# Patient Record
Sex: Male | Born: 1990 | Race: White | Hispanic: No | Marital: Single | Smoking: Current every day smoker
Health system: Southern US, Community
[De-identification: ages and names within clinical notes are randomized; demographics above are authoritative.]

---
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
## 2012-04-19 ENCOUNTER — Emergency Department (HOSPITAL_COMMUNITY): Payer: 59

## 2012-04-19 ENCOUNTER — Emergency Department (HOSPITAL_COMMUNITY)
Admission: EM | Admit: 2012-04-19 | Discharge: 2012-04-19 | Disposition: A | Discharge status: Home / Self Care | Payer: 59 | Attending: Emergency Medicine | Admitting: Emergency Medicine

## 2012-04-19 DIAGNOSIS — J019 Acute sinusitis, unspecified: Secondary | ICD-10-CM | POA: Insufficient documentation

## 2012-04-19 DIAGNOSIS — J3489 Other specified disorders of nose and nasal sinuses: Secondary | ICD-10-CM | POA: Insufficient documentation

## 2012-04-19 DIAGNOSIS — R059 Cough, unspecified: Secondary | ICD-10-CM | POA: Insufficient documentation

## 2012-04-19 DIAGNOSIS — J392 Other diseases of pharynx: Secondary | ICD-10-CM | POA: Insufficient documentation

## 2012-04-19 DIAGNOSIS — J029 Acute pharyngitis, unspecified: Secondary | ICD-10-CM | POA: Insufficient documentation

## 2012-04-19 DIAGNOSIS — B338 Other specified viral diseases: Secondary | ICD-10-CM | POA: Insufficient documentation

## 2012-04-19 DIAGNOSIS — R05 Cough: Secondary | ICD-10-CM | POA: Insufficient documentation

## 2012-04-19 DIAGNOSIS — J329 Chronic sinusitis, unspecified: Secondary | ICD-10-CM

## 2012-04-19 DIAGNOSIS — J069 Acute upper respiratory infection, unspecified: Secondary | ICD-10-CM | POA: Insufficient documentation

## 2012-04-19 MED ORDER — AMOXICILLIN 500 MG PO CAPS: 500.0000 mg | ORAL_CAPSULE | Freq: Three times a day (TID) | ORAL | Status: DC

## 2012-04-19 NOTE — ED Provider Notes (Signed)
History     CSN: [id number]  Arrival date & time 04/19/12  1415   First MD Initiated Contact with Patient 04/19/12 1453      No chief complaint on file.   (Consider location/radiation/quality/duration/timing/severity/associated sxs/prior treatment) HPI Nicolas Lopez is a 21 y.o. male who presents to ED with complaint of fever, chills, nasal congestion, sinus pain, cough. State chills at home, sweats, did not measure temperature. Symptoms began 4 days ago, worsening. Pt has been taking mucinex, afrin, netti pot for congestion with no relief. States every year gets sinus infection and only improves with antibitotics. Per mother who is here with pt, states he has felt very hot, and gets sweats, no energy, appears weak. Pt denies headache or neck pain or stiffness. No n/v/d. No abdominal pain. No SOB or chest pain.   No past medical history on file.  No past surgical history on file.  No family history on file.  History  Substance Use Topics  . Smoking status: Not on file  . Smokeless tobacco: Not on file  . Alcohol Use: Not on file      Review of Systems  Constitutional: Negative for fever and chills.  HENT: Positive for congestion, sore throat, postnasal drip and sinus pressure. Negative for neck pain and neck stiffness.   Respiratory: Positive for cough. Negative for chest tightness, shortness of breath and wheezing.   Cardiovascular: Negative.   Gastrointestinal: Negative.   Genitourinary: Negative for dysuria, urgency and hematuria.  Musculoskeletal: Negative.   Skin: Negative for rash.  Neurological: Negative for weakness and headaches.  Hematological: Negative for adenopathy.    Allergies  Review of patient's allergies indicates not on file.  Home Medications   Current Outpatient Rx  Name  Route  Sig  Dispense  Refill  . GUAIFENESIN ER 600 MG PO TB12   Oral   Take 1,200 mg by mouth 2 (two) times daily.         Marland Kitchen OXYMETAZOLINE HCL 0.05 % NA SOLN    Nasal   Place 2 sprays into the nose 2 (two) times daily.           BP 146/75  Pulse 80  Temp 98.8 F (37.1 C) (Oral)  Resp 16  SpO2 99%  Physical Exam  Nursing note and vitals reviewed. Constitutional: He is oriented to person, place, and time. He appears well-developed and well-nourished. No distress.  HENT:  Head: Atraumatic.  Right Ear: Tympanic membrane, external ear and ear canal normal.  Left Ear: Tympanic membrane, external ear and ear canal normal.  Nose: Mucosal edema and rhinorrhea present. Right sinus exhibits maxillary sinus tenderness and frontal sinus tenderness. Left sinus exhibits maxillary sinus tenderness and frontal sinus tenderness.  Mouth/Throat: Uvula is midline and mucous membranes are normal. Posterior oropharyngeal erythema present. No oropharyngeal exudate, posterior oropharyngeal edema or tonsillar abscesses.  Eyes: Conjunctivae normal are normal.  Neck: Neck supple.  Cardiovascular: Normal rate, regular rhythm and normal heart sounds.   Pulmonary/Chest: Effort normal and breath sounds normal. No respiratory distress. He has no wheezes. He has no rales.  Abdominal: Soft. Bowel sounds are normal. He exhibits no distension. There is no tenderness. There is no rebound.  Musculoskeletal: He exhibits no edema.  Neurological: He is alert and oriented to person, place, and time.  Skin: Skin is warm and dry.  Psychiatric: He has a normal mood and affect.    ED Course  Procedures (including critical care time)  Labs Reviewed - No data  to display Dg Chest 2 View  04/19/2012  *RADIOLOGY REPORT*  Clinical Data: Productive cough.  CHEST - 2 VIEW  Comparison: PA and lateral chest 06/03/2006.  Findings: Lungs are clear.  No pneumothorax or effusion.  Heart size normal.  No focal bony abnormality.  IMPRESSION: Negative chest.   Original Report Authenticated By: Holley Dexter, M.D.      1. Sinusitis   2. URI (upper respiratory infection)       MDM  PT  with URI symptoms, mainly worsening nasal congestion and sinus pressure. Pt afebrile here, but possible fevers at home. Pt non toxic appearing. No meningismus. No SOB. CXR clear. Pt asking for antibiotic. Explained this is still most likely viral and should improve on its own with symptomatic tx. Will give rx for amoxil and instructed to take only if not improving with decongestants, and supportive treatment. Pt voiced understanding.   Filed Vitals:   04/19/12 1431 04/19/12 1435  BP: 146/75   Pulse: 80   Temp:  98.8 F (37.1 C)  TempSrc:  Oral  Resp: 16   SpO2: 99%            Lottie Mussel, PA 04/19/12 1544

## 2012-04-19 NOTE — ED Notes (Signed)
Pt is having a productive cough with yellow sputum, sinus pain, lethargy sore throat chills and sweats.

## 2012-04-20 NOTE — ED Provider Notes (Signed)
Medical screening examination/treatment/procedure(s) were performed by non-physician practitioner and as supervising physician I was immediately available for consultation/collaboration.   Derwood Kaplan, MD 04/20/12 0000

## 2017-09-26 DIAGNOSIS — S93492A Sprain of other ligament of left ankle, initial encounter: Secondary | ICD-10-CM | POA: Diagnosis not present

## 2017-10-24 DIAGNOSIS — J029 Acute pharyngitis, unspecified: Secondary | ICD-10-CM | POA: Diagnosis not present

## 2017-10-24 DIAGNOSIS — J209 Acute bronchitis, unspecified: Secondary | ICD-10-CM | POA: Diagnosis not present

## 2017-10-24 DIAGNOSIS — J329 Chronic sinusitis, unspecified: Secondary | ICD-10-CM | POA: Diagnosis not present

## 2018-01-10 DIAGNOSIS — J029 Acute pharyngitis, unspecified: Secondary | ICD-10-CM | POA: Diagnosis not present

## 2018-01-10 DIAGNOSIS — R0981 Nasal congestion: Secondary | ICD-10-CM | POA: Diagnosis not present

## 2018-01-10 DIAGNOSIS — J069 Acute upper respiratory infection, unspecified: Secondary | ICD-10-CM | POA: Diagnosis not present

## 2018-01-10 DIAGNOSIS — R05 Cough: Secondary | ICD-10-CM | POA: Diagnosis not present

## 2019-03-15 DIAGNOSIS — F17200 Nicotine dependence, unspecified, uncomplicated: Secondary | ICD-10-CM | POA: Diagnosis not present

## 2019-03-15 DIAGNOSIS — E669 Obesity, unspecified: Secondary | ICD-10-CM | POA: Diagnosis not present

## 2019-03-15 DIAGNOSIS — Z0000 Encounter for general adult medical examination without abnormal findings: Secondary | ICD-10-CM | POA: Diagnosis not present

## 2019-04-09 DIAGNOSIS — R05 Cough: Secondary | ICD-10-CM | POA: Diagnosis not present

## 2019-04-09 DIAGNOSIS — Z9189 Other specified personal risk factors, not elsewhere classified: Secondary | ICD-10-CM | POA: Diagnosis not present

## 2019-05-28 DIAGNOSIS — M549 Dorsalgia, unspecified: Secondary | ICD-10-CM | POA: Diagnosis not present

## 2019-07-12 DIAGNOSIS — Z23 Encounter for immunization: Secondary | ICD-10-CM | POA: Diagnosis not present

## 2019-08-10 DIAGNOSIS — Z23 Encounter for immunization: Secondary | ICD-10-CM | POA: Diagnosis not present

## 2019-09-17 DIAGNOSIS — A084 Viral intestinal infection, unspecified: Secondary | ICD-10-CM | POA: Diagnosis not present

## 2019-12-02 DIAGNOSIS — Z20822 Contact with and (suspected) exposure to covid-19: Secondary | ICD-10-CM | POA: Diagnosis not present

## 2019-12-15 DIAGNOSIS — Z20822 Contact with and (suspected) exposure to covid-19: Secondary | ICD-10-CM | POA: Diagnosis not present

## 2020-02-14 ENCOUNTER — Emergency Department (HOSPITAL_COMMUNITY)
Admission: EM | Admit: 2020-02-14 | Discharge: 2020-02-14 | Disposition: A | Discharge status: Home / Self Care | Payer: BC Managed Care – PPO | Attending: Emergency Medicine | Admitting: Emergency Medicine

## 2020-02-14 ENCOUNTER — Emergency Department (HOSPITAL_COMMUNITY): Payer: BC Managed Care – PPO

## 2020-02-14 ENCOUNTER — Encounter (HOSPITAL_COMMUNITY): Payer: Self-pay | Admitting: Emergency Medicine

## 2020-02-14 ENCOUNTER — Other Ambulatory Visit: Payer: Self-pay

## 2020-02-14 DIAGNOSIS — R0602 Shortness of breath: Secondary | ICD-10-CM | POA: Diagnosis not present

## 2020-02-14 DIAGNOSIS — R202 Paresthesia of skin: Secondary | ICD-10-CM | POA: Diagnosis not present

## 2020-02-14 DIAGNOSIS — R079 Chest pain, unspecified: Secondary | ICD-10-CM

## 2020-02-14 DIAGNOSIS — R0789 Other chest pain: Secondary | ICD-10-CM | POA: Insufficient documentation

## 2020-02-14 LAB — BASIC METABOLIC PANEL
Anion gap: 11 (ref 5–15)
BUN: 10 mg/dL (ref 6–20)
CO2: 26 mmol/L (ref 22–32)
Calcium: 9.9 mg/dL (ref 8.9–10.3)
Chloride: 104 mmol/L (ref 98–111)
Creatinine, Ser: 1.21 mg/dL (ref 0.61–1.24)
GFR, Estimated: >60 mL/min (ref >60)
Glucose, Bld: 93 mg/dL (ref 70–99)
Potassium: 4.1 mmol/L (ref 3.5–5.1)
Sodium: 141 mmol/L (ref 135–145)

## 2020-02-14 LAB — CBC
HCT: 50.4 % (ref 39.0–52.0)
Hemoglobin: 17.6 g/dL — ABNORMAL HIGH (ref 13.0–17.0)
MCH: 32.8 pg (ref 26.0–34.0)
MCHC: 34.9 g/dL (ref 30.0–36.0)
MCV: 94 fL (ref 80.0–100.0)
Platelets: 207 10*3/uL (ref 150–400)
RBC: 5.36 MIL/uL (ref 4.22–5.81)
RDW: 11.9 % (ref 11.5–15.5)
WBC: 6.6 10*3/uL (ref 4.0–10.5)
nRBC: 0 % (ref 0.0–0.2)

## 2020-02-14 LAB — TROPONIN I (HIGH SENSITIVITY): Troponin I (High Sensitivity): 4 ng/L (ref <18)

## 2020-02-14 MED ORDER — HYDROXYZINE HCL 25 MG PO TABS: 25.0000 mg | ORAL_TABLET | Freq: Four times a day (QID) | ORAL | 0 refills | Status: DC

## 2020-02-14 NOTE — Discharge Instructions (Signed)
Your cardiac work-up today was reassuring.  However, you did have EKG changes that were abnormal.  I would like you to follow-up with cardiology for ongoing evaluation and management.  Your symptoms are likely related to anxiety.  Please follow-up with your primary care provider at [facility] regarding today's encounter.  I have prescribed you a course of Vistaril which I would like for you to take as needed if you develop recurrence of symptoms.  They are generally well-tolerated as they are first induration and histamine that is a good as needed antianxiety medication.  They can however make you drowsy.  Please return to the ED or seek immediate medical attention should you experience any new or worsening symptoms.

## 2020-02-14 NOTE — ED Triage Notes (Signed)
Patient arrives to ED with complaints of chest pain and shortness of breath  starting last night. Pt woke up this morning and the symptoms continued with left arm numbness. Pt states has been under extreme pressure this week and anxiety has been high (Parents having surgery & job).

## 2020-02-14 NOTE — ED Provider Notes (Signed)
[facility] EMERGENCY DEPARTMENT Provider Note   CSN: [id number] Arrival date & time: 02/14/20  9381     History Chief Complaint  Patient presents with  . Chest Pain  . Shortness of Breath    Nicolas Lopez is a 29 y.o. male with no past medical history presents the ED with a 1 day history of chest pain and shortness of breath.  On my examination, patient states that he was driving home after visiting his parents last evening around 10 PM when he developed onset of left-sided chest "tightness" with associated shortness of breath symptoms.  He denies any respiratory difficulty, cough, or exertional chest pain.  No personal history of cardiac disease or family history of premature cardiac death.  The only medication that he takes regularly is an antihistamine.  No history of clots or clotting disorder.  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  Patient denies any history of similar symptoms.  No history of asthma.  He does intermittently experience GERD symptoms, but took Tums at home without any relief.  Patient does admit to occasional vape and cigar usage.   Patient does note that his mother had surgery a couple of months ago and his father just required emergent lumbar fusion as he was having functional deficits.  He reports that he has been particularly stressed taking care of his parents.  He also feels as though he has had increased work-related stress.  Lastly, patient is also experiencing intermittent tingling down the ulnar aspect of left arm.  No overt numbness.  However, given his collection of symptoms, his mother advised him to come to the ED for evaluation.  [facility].  HPI    History reviewed. No pertinent family history.  Social History   Tobacco Use  . Smoking status: Not on file  Substance Use Topics  . Alcohol use: Not on file  . Drug use: Not on file    Home Medications Prior to Admission  medications   Medication Sig Start Date End Date Taking? Authorizing Provider  amoxicillin (AMOXIL) 500 MG capsule Take 1 capsule (500 mg total) by mouth 3 (three) times daily. 04/19/12   Kirichenko, Tatyana, PA-C  guaiFENesin (MUCINEX) 600 MG 12 hr tablet Take 1,200 mg by mouth 2 (two) times daily.    [provider]  hydrOXYzine (ATARAX/VISTARIL) 25 MG tablet Take 1 tablet (25 mg total) by mouth every 6 (six) hours. 02/14/20   Lorelee New, PA-C  oxymetazoline (AFRIN) 0.05 % nasal spray Place 2 sprays into the nose 2 (two) times daily.    [provider]    Allergies    Patient has no known allergies.  Review of Systems   Review of Systems  All other systems reviewed and are negative.   Physical Exam Updated Vital Signs BP 111/67   Pulse (!) 51   Temp 97.9 F (36.6 C) (Oral)   Resp 15   Ht 5\' 9"  (1.753 m)   Wt 113.4 kg   SpO2 98%   BMI 36.92 kg/m   Physical Exam Vitals and nursing note reviewed. Exam conducted with a chaperone present.  Constitutional:      General: He is not in acute distress.    Appearance: Normal appearance. He is not ill-appearing.  HENT:     Head: Normocephalic and atraumatic.  Eyes:     General: No scleral icterus.    Conjunctiva/sclera: Conjunctivae normal.  Cardiovascular:  Rate and Rhythm: Normal rate and regular rhythm.     Pulses: Normal pulses.     Heart sounds: Normal heart sounds.  Pulmonary:     Effort: Pulmonary effort is normal. No respiratory distress.     Breath sounds: Normal breath sounds. No stridor. No wheezing, rhonchi or rales.     Comments: No increased work of breathing.  No reproducible chest wall tenderness.  CTA bilaterally. Chest:     Chest wall: No tenderness.  Abdominal:     General: Abdomen is flat. There is no distension.     Palpations: Abdomen is soft.     Tenderness: There is no abdominal tenderness. There is no guarding.  Skin:    General: Skin is dry.     Capillary Refill:  Capillary refill takes less than 2 seconds.  Neurological:     General: No focal deficit present.     Mental Status: He is alert and oriented to person, place, and time.     GCS: GCS eye subscore is 4. GCS verbal subscore is 5. GCS motor subscore is 6.     Cranial Nerves: No cranial nerve deficit.     Sensory: No sensory deficit.     Motor: No weakness.     Coordination: Coordination normal.     Gait: Gait normal.     Comments: Neurologically intact.  No deficits noted.  Assessed radial, ulnar, and median nerve on affected arm, all of which intact.  Capillary refill less than 2 seconds.  Radial pulse intact and symmetric with contralateral arm.  No weakness.  Psychiatric:        Mood and Affect: Mood normal.        Behavior: Behavior normal.        Thought Content: Thought content normal.     ED Results / Procedures / Treatments   Labs (all labs ordered are listed, but only abnormal results are displayed) Labs Reviewed  CBC - Abnormal; Notable for the following components:      Result Value   Hemoglobin 17.6 (*)    All other components within normal limits  BASIC METABOLIC PANEL  TROPONIN I (HIGH SENSITIVITY)    EKG EKG Interpretation  Date/Time:  Thursday February 14 2020 10:03:54 EDT Ventricular Rate:  56 PR Interval:  132 QRS Duration: 92 QT Interval:  406 QTC Calculation: 391 R Axis:   72 Text Interpretation: Sinus bradycardia with sinus arrhythmia T wave abnormality, consider inferior ischemia Abnormal ECG No STEMI Confirmed by Alvester Chou [phone]) on 02/14/2020 10:43:58 AM   Radiology DG Chest 2 View  Result Date: 02/14/2020 CLINICAL DATA:  Shortness of breath EXAM: CHEST - 2 VIEW COMPARISON:  04/19/2012 FINDINGS: Normal heart size and mediastinal contours. No acute infiltrate or edema. No effusion or pneumothorax. No acute osseous findings. IMPRESSION: Negative chest Electronically Signed   By: Marnee Spring M.D.   On: 02/14/2020 10:41     Procedures Procedures (including critical care time)  Medications Ordered in ED Medications - No data to display  ED Course  I have reviewed the triage vital signs and the nursing notes.  Pertinent labs & imaging results that were available during my care of the patient were reviewed by me and considered in my medical decision making (see chart for details).    MDM Rules/Calculators/A&P HEAR Score: 3                        Given patient's worsening symptoms, will  obtain cardiac work-up.  Labs CBC: Unremarkable. BMP: No derangement.  Renal function intact. Troponin: 4.  Given chronicity, do not need to trend.  EKG shows sinus rhythm, however with mild bradycardia at 56 bpm and T wave abnormalities inferiorly.  DG chest is personally reviewed and demonstrates no acute cardiopulmonary disease.  Patient is presenting for chest "tightness" with associated SOB and left arm tingling.  Comprehensive work-up obtained to assess for cause of symptoms.  EKG without evidence of STEMI.  Troponin is negative, lowering concern for NSTEMI.  Patient has a low Heart Score which lowers suspicion for ACS.  I have low suspicion for dissection given normal pulses in extremities and normal mediastinum on CXR.  I reviewed DG Chest and there is no evidence of pneumothorax or consolidation concerning for pneumonia.  There is also no pleural effusion on x-ray or history suggestive of possible esophageal rupture.  Patient is PERC negative, low risk for PE per Wells Criteria.  I discussed the patient the exact etiology of their chest pain is unclear and warrants follow up with a primary care provider. Also discussed that while their risk for ACS is low, it is not completely eliminated and I discussed with patient specific warning signs and return precautions.  The patient was counseled on the dangers of tobacco use, and was advised to quit.  Reviewed strategies to maximize success, including removing cigarettes  and smoking materials from environment, stress management, substitution of other forms of reinforcement, support of family/friends and written materials. Total time was 5 min CPT code 35329.   We will refer to cardiology for ongoing evaluation and management.  He also will need to follow-up with his primary care provider.  Final Clinical Impression(s) / ED Diagnoses Final diagnoses:  Nonspecific chest pain    Rx / DC Orders ED Discharge Orders         Ordered    hydrOXYzine (ATARAX/VISTARIL) 25 MG tablet  Every 6 hours        02/14/20 1444           Elvera Maria 02/14/20 1450    Terald Sleeper, MD 02/14/20 1725

## 2020-04-30 DIAGNOSIS — Z03818 Encounter for observation for suspected exposure to other biological agents ruled out: Secondary | ICD-10-CM | POA: Diagnosis not present

## 2020-04-30 DIAGNOSIS — J069 Acute upper respiratory infection, unspecified: Secondary | ICD-10-CM | POA: Diagnosis not present

## 2020-05-06 ENCOUNTER — Ambulatory Visit (INDEPENDENT_AMBULATORY_CARE_PROVIDER_SITE_OTHER): Payer: BC Managed Care – PPO

## 2020-05-06 ENCOUNTER — Other Ambulatory Visit: Payer: Self-pay

## 2020-05-06 ENCOUNTER — Encounter: Payer: Self-pay | Admitting: *Deleted

## 2020-05-06 ENCOUNTER — Ambulatory Visit: Payer: BC Managed Care – PPO | Admitting: Internal Medicine

## 2020-05-06 ENCOUNTER — Ambulatory Visit (HOSPITAL_COMMUNITY): Payer: BC Managed Care – PPO | Attending: Cardiovascular Disease

## 2020-05-06 ENCOUNTER — Encounter: Payer: Self-pay | Admitting: Internal Medicine

## 2020-05-06 VITALS — BP 120/86 | HR 63 | Ht 69.0 in | Wt 250.8 lb

## 2020-05-06 DIAGNOSIS — R079 Chest pain, unspecified: Secondary | ICD-10-CM | POA: Insufficient documentation

## 2020-05-06 DIAGNOSIS — R002 Palpitations: Secondary | ICD-10-CM

## 2020-05-06 DIAGNOSIS — R0602 Shortness of breath: Secondary | ICD-10-CM | POA: Diagnosis not present

## 2020-05-06 DIAGNOSIS — Z13220 Encounter for screening for lipoid disorders: Secondary | ICD-10-CM | POA: Diagnosis not present

## 2020-05-06 LAB — ECHOCARDIOGRAM COMPLETE
Area-P 1/2: 6.32 cm2
Height: 69 in
S' Lateral: 3.1 cm
Weight: 4012.8 oz

## 2020-05-06 LAB — LIPID PANEL
Chol/HDL Ratio: 5.3 ratio — ABNORMAL HIGH (ref 0.0–5.0)
Cholesterol, Total: 170 mg/dL (ref 100–199)
HDL: 32 mg/dL — ABNORMAL LOW (ref >39)
LDL Chol Calc (NIH): 124 mg/dL — ABNORMAL HIGH (ref 0–99)
Triglycerides: 75 mg/dL (ref 0–149)
VLDL Cholesterol Cal: 14 mg/dL (ref 5–40)

## 2020-05-06 NOTE — Patient Instructions (Addendum)
Medication Instructions:  Your provider recommends that you continue on your current medications as directed. Please refer to the Current Medication list given to you today.   *If you need a refill on your cardiac medications before your next appointment, please call your pharmacy*  Lab Work: TODAY! Lipids If you have labs (blood work) drawn today and your tests are completely normal, you will receive your results only by: Marland Kitchen MyChart Message (if you have MyChart) OR . A paper copy in the mail If you have any lab test that is abnormal or we need to change your treatment, we will call you to review the results.  Testing/Procedures: Your provider has requested that you have an echocardiogram. Echocardiography is a painless test that uses sound waves to create images of your heart. It provides your doctor with information about the size and shape of your heart and how well your heart's chambers and valves are working. This procedure takes approximately one hour. There are no restrictions for this procedure.    Dr. Izora Ribas recommends you wear a HEART MONITOR (Zio patch) for 14 days.  Follow-Up: At [facility], you and your health needs are our priority.  As part of our continuing mission to provide you with exceptional heart care, we have created designated Provider Care Teams.  These Care Teams include your primary Cardiologist (physician) and Advanced Practice Providers (APPs -  Physician Assistants and Nurse Practitioners) who all work together to provide you with the care you need, when you need it. Your next appointment:   3 month(s) The format for your next appointment:   In Person Provider:   Riley Lam, MD    Christena Deem- [place] Term Monitor Instructions   Your physician has requested you wear your ZIO patch monitor 14 days.   This is a single patch monitor.  [employer] supplies one patch monitor per enrollment.  Additional stickers are not available.   Please do not apply  patch if you will be having a Nuclear Stress Test, Echocardiogram, Cardiac CT, MRI, or Chest Xray during the time frame you would be wearing the monitor. The patch cannot be worn during these tests.  You cannot remove and re-apply the ZIO XT patch monitor.   Your ZIO patch monitor will be sent USPS Priority mail from [facility] directly to your home address. The monitor may also be mailed to a PO BOX if home delivery is not available.   It may take 3-5 days to receive your monitor after you have been enrolled.   Once you have received you monitor, please review enclosed instructions.  Your monitor has already been registered assigning a specific monitor serial # to you.   Applying the monitor   Shave hair from upper left chest.   Hold abrader disc by orange tab.  Rub abrader in 40 strokes over left upper chest as indicated in your monitor instructions.   Clean area with 4 enclosed alcohol pads .  Use all pads to assure are is cleaned thoroughly.  Let dry.   Apply patch as indicated in monitor instructions.  Patch will be place under collarbone on left side of chest with arrow pointing upward.   Rub patch adhesive wings for 2 minutes.Remove white label marked "1".  Remove white label marked "2".  Rub patch adhesive wings for 2 additional minutes.   While looking in a mirror, press and release button in center of patch.  A small green light will flash 3-4 times .  This  will be your only indicator the monitor has been turned on.     Do not shower for the first 24 hours.  You may shower after the first 24 hours.   Press button if you feel a symptom. You will hear a small click.  Record Date, Time and Symptom in the Patient Log Book.   When you are ready to remove patch, follow instructions on last 2 pages of Patient Log Book.  Stick patch monitor onto last page of Patient Log Book.   Place Patient Log Book in [place] box.  Use locking tab on box and tape box closed securely.  The Orange  and [employer] has [employer] on it.  Please place in mailbox as soon as possible.  Your physician should have your test results approximately 7 days after the monitor has been mailed back to [facility].   Call [facility] Customer Care at [phone] if you have questions regarding your ZIO XT patch monitor.  Call them immediately if you see an orange light blinking on your monitor.   If your monitor falls off in less than 4 days contact our Monitor department at [phone].  If your monitor becomes loose or falls off after 4 days call [employer] at [phone] for suggestions on securing your monitor.

## 2020-05-06 NOTE — Progress Notes (Signed)
Patient ID: Nicolas Lopez, male   DOB: [birth year], 30 y.o.   MRN: [id number] Patient enrolled for [employer] to ship a 14 day ZIO XT [place] term holter monitor to address on record.

## 2020-05-06 NOTE — Progress Notes (Signed)
Cardiology Office Note:    Date:  05/06/2020   ID:  Nicolas Lopez, DOB 1991-02-22, MRN [id number]  PCP:  Nicolas Raider, MD  [facility] Cardiologist:  Nicolas Constant, MD  [facility] Electrophysiologist:  None   CC: shortness of breath and tightness  Consulted for the evaluation of chest pain at the behest of Nicolas Raider, MD  History of Present Illness:    Nicolas Lopez is a 30 y.o. male with Obesity, elevated blood pressure, tobacco use who presents for evaluation.  Patient notes that he is feeling persistence of shortness of breath and tightness in his chest.  Start in October.  Had ED Eval for CP 02/14/20 associated with SOB.  Work up at that time benign.  Went off of nicotine with no change in his chest pain.   Has had chest tightness that is associated with having a problem catching his breath.  Discomfort occurs with spontaneously, worsens with no triggers, and improves on its own.  Occurs when his heart is racing.  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  Did an hour on a treadmill 05/05/20 with no shortness of breath, DOE outside of these spells.  No PND or orthopnea.  No bendopnea, notes weight gain but no leg swelling , or abdominal swelling.  No syncope or near syncope since this has started:  Passed out from over-exertion event in the [employer] (doesn't remember a lot of the details but remembers it was post exercise and training and not during and had aura).  Notes that his palpitations or funny heart beats have occurred 3-4 times in two weeks.     Patient reports prior cardiac testing including  distant heart testing NOS as a child for a heart murmur. No history of prematurity.  No drug use.  History reviewed. No pertinent past medical history.  History reviewed. No pertinent surgical history.  Current Medications: Current Meds  Medication Sig  . fexofenadine (ALLEGRA) 60 MG tablet Take 60 mg by  mouth 2 (two) times daily.  . fluticasone (FLONASE) 50 MCG/ACT nasal spray Place into both nostrils daily.  Marland Kitchen guaiFENesin (MUCINEX) 600 MG 12 hr tablet Take 1,200 mg by mouth 2 (two) times daily.  . Multiple Vitamin (MULTIVITAMIN) tablet Take 1 tablet by mouth daily.  Marland Kitchen oxymetazoline (AFRIN) 0.05 % nasal spray Place 2 sprays into the nose 2 (two) times daily.     Allergies:   Patient has no known allergies.   Social History   Socioeconomic History  . Marital status: Single    Spouse name: Not on file  . Number of children: Not on file  . Years of education: Not on file  . Highest education level: Not on file  Occupational History  . Not on file  Tobacco Use  . Smoking status: Current Every Day Smoker  . Smokeless tobacco: Never Used  Substance and Sexual Activity  . Alcohol use: Never  . Drug use: Never  . Sexual activity: Not on file  Other Topics Concern  . Not on file  Social History Narrative  . Not on file   Social Determinants of Health   Financial Resource Strain: Not on file  Food Insecurity: Not on file  Transportation Needs: Not on file  Physical Activity: Not on file  Stress: Not on file  Social Connections: Not on file     Family History: History of coronary artery disease notable for multiple members  of his dad's side. History of heart failure notable for multiple members. No history of cardiomyopathies including hypertrophic cardiomyopathy, left ventricular non-compaction, or arrhythmogenic right ventricular cardiomyopathy. History of arrhythmia notable for no members. Denies family history of sudden cardiac death including drowning, car accidents, or unexplained deaths in the family. No history of bicuspid aortic valve or aortic aneurysm or dissection.  ROS:   Please see the history of present illness.    All other systems reviewed and are negative.  EKGs/Labs/Other Studies Reviewed:    The following studies were reviewed today:  EKG:    05/06/20:  SR rate 63 inferior TWI   Recent Labs: 02/14/2020: BUN 10; Creatinine, Ser 1.21; Hemoglobin 17.6; Platelets 207; Potassium 4.1; Sodium 141  Recent Lipid Panel No results found for: CHOL, TRIG, HDL, CHOLHDL, VLDL, LDLCALC, LDLDIRECT   Risk Assessment/Calculations:     N/A  Physical Exam:    VS:  BP 120/86   Pulse 63   Ht 5\' 9"  (1.753 m)   Wt 250 lb 12.8 oz (113.8 kg)   SpO2 97%   BMI 37.04 kg/m     Wt Readings from Last 3 Encounters:  05/06/20 250 lb 12.8 oz (113.8 kg)  02/14/20 250 lb (113.4 kg)     GEN:  Well nourished, well developed in no acute distress HEENT: Normal NECK: No JVD; No carotid bruits LYMPHATICS: No lymphadenopathy CARDIAC: RRR, no murmurs, rubs, gallops RESPIRATORY:  Clear to auscultation without rales, wheezing or rhonchi  ABDOMEN: Soft, non-tender, non-distended MUSCULOSKELETAL:  No edema; No deformity  SKIN: Warm and dry NEUROLOGIC:  Alert and oriented x 3 PSYCHIATRIC:  Normal affect   ASSESSMENT:    1. Palpitations   2. Shortness of breath   3. Chest pain of uncertain etiology    PLAN:    In order of problems listed above:  Chest Pain Prior Near-Syncope - The patient presents with possibly cardiac - EKG shows without evidence of accessory pathway, ventricular pacing, digoxin use, LBBB, or baseline ST changes. - would check cholesterol today - would recommend echocardiogram given prior near syncope - offered exercise stress test (NPO at midnight); discussed risks, benefits, and alternatives of the diagnostic procedure including chest pain, arrhythmia, and death.  Patient defers testing at this time (issues with quarantining after COVID testing)  Palpitations; possible SVT - will obtain 14-day non live heart monitor (ZioPatch)  Tobacco Abuse - discussed the dangers of tobacco use, both inhaled and oral, which include, but are not limited to cardiovascular disease, increased cancer risk of multiple types of cancer, COPD,  peripheral arterial disease, strokes. - counseled on the benefits of smoking cessation. - firmly advised to quit.  - we also reviewed strategies to maximize success, including:  Removing cigarettes and smoking materials from environment   Stress management  Substitution of other forms of reinforcement (the one cigarette a day approach)  Support of family/friends and group smoking cessation  Selecting a quit date  Patient provided contact information for [employer] or 1-800-QUIT-NOW  Patient provided with [employer] free smoking cessation classes: [phone] and [phone]   3 month follow up unless new symptoms or abnormal test results warranting change in plan  Would be reasonable for  Virtual Follow up  Would be reasonable for  APP Follow up   Medication Adjustments/Labs and Tests Ordered: Current medicines are reviewed at length with the patient today.  Concerns regarding medicines are outlined above.  Orders Placed This Encounter  Procedures  . Lipid panel  .  [place] TERM MONITOR (3-14 DAYS)  . EKG 12-Lead  . ECHOCARDIOGRAM COMPLETE   No orders of the defined types were placed in this encounter.   Patient Instructions  Medication Instructions:  Your provider recommends that you continue on your current medications as directed. Please refer to the Current Medication list given to you today.   *If you need a refill on your cardiac medications before your next appointment, please call your pharmacy*  Lab Work: TODAY! Lipids If you have labs (blood work) drawn today and your tests are completely normal, you will receive your results only by: Marland Kitchen. MyChart Message (if you have MyChart) OR . A paper copy in the mail If you have any lab test that is abnormal or we need to change your treatment, we will call you to review the results.  Testing/Procedures: Your provider has requested that you have an echocardiogram. Echocardiography is a painless  test that uses sound waves to create images of your heart. It provides your doctor with information about the size and shape of your heart and how well your heart's chambers and valves are working. This procedure takes approximately one hour. There are no restrictions for this procedure.    Dr. Izora Ribashandrasekhar recommends you wear a HEART MONITOR (Zio patch) for 14 days.  Follow-Up: At [facility], you and your health needs are our priority.  As part of our continuing mission to provide you with exceptional heart care, we have created designated Provider Care Teams.  These Care Teams include your primary Cardiologist (physician) and Advanced Practice Providers (APPs -  Physician Assistants and Nurse Practitioners) who all work together to provide you with the care you need, when you need it. Your next appointment:   3 month(s) The format for your next appointment:   In Person Provider:   Riley LamMahesh Chandrasekhar, MD    Christena DeemZIO XT- [place] Term Monitor Instructions   Your physician has requested you wear your ZIO patch monitor 14 days.   This is a single patch monitor.  [employer] supplies one patch monitor per enrollment.  Additional stickers are not available.   Please do not apply patch if you will be having a Nuclear Stress Test, Echocardiogram, Cardiac CT, MRI, or Chest Xray during the time frame you would be wearing the monitor. The patch cannot be worn during these tests.  You cannot remove and re-apply the ZIO XT patch monitor.   Your ZIO patch monitor will be sent USPS Priority mail from [facility] directly to your home address. The monitor may also be mailed to a PO BOX if home delivery is not available.   It may take 3-5 days to receive your monitor after you have been enrolled.   Once you have received you monitor, please review enclosed instructions.  Your monitor has already been registered assigning a specific monitor serial # to you.   Applying the monitor   Shave hair from  upper left chest.   Hold abrader disc by orange tab.  Rub abrader in 40 strokes over left upper chest as indicated in your monitor instructions.   Clean area with 4 enclosed alcohol pads .  Use all pads to assure are is cleaned thoroughly.  Let dry.   Apply patch as indicated in monitor instructions.  Patch will be place under collarbone on left side of chest with arrow pointing upward.   Rub patch adhesive wings for 2 minutes.Remove white label marked "1".  Remove white label marked "2".  Rub patch adhesive  wings for 2 additional minutes.   While looking in a mirror, press and release button in center of patch.  A small green light will flash 3-4 times .  This will be your only indicator the monitor has been turned on.     Do not shower for the first 24 hours.  You may shower after the first 24 hours.   Press button if you feel a symptom. You will hear a small click.  Record Date, Time and Symptom in the Patient Log Book.   When you are ready to remove patch, follow instructions on last 2 pages of Patient Log Book.  Stick patch monitor onto last page of Patient Log Book.   Place Patient Log Book in [place] box.  Use locking tab on box and tape box closed securely.  The [employer] has [employer] on it.  Please place in mailbox as soon as possible.  Your physician should have your test results approximately 7 days after the monitor has been mailed back to [facility].   Call [facility] Customer Care at [phone] if you have questions regarding your ZIO XT patch monitor.  Call them immediately if you see an orange light blinking on your monitor.   If your monitor falls off in less than 4 days contact our Monitor department at [phone].  If your monitor becomes loose or falls off after 4 days call [employer] at [phone] for suggestions on securing your monitor.       Signed, Nicolas Constant, MD  05/06/2020 11:01 AM    [facility]

## 2020-05-10 DIAGNOSIS — R002 Palpitations: Secondary | ICD-10-CM | POA: Diagnosis not present

## 2020-06-02 DIAGNOSIS — R002 Palpitations: Secondary | ICD-10-CM | POA: Diagnosis not present

## 2020-08-15 ENCOUNTER — Ambulatory Visit: Payer: BC Managed Care – PPO | Admitting: Internal Medicine

## 2020-09-16 DIAGNOSIS — L03031 Cellulitis of right toe: Secondary | ICD-10-CM | POA: Diagnosis not present

## 2020-09-26 DIAGNOSIS — Z125 Encounter for screening for malignant neoplasm of prostate: Secondary | ICD-10-CM | POA: Diagnosis not present

## 2020-09-26 DIAGNOSIS — Z0000 Encounter for general adult medical examination without abnormal findings: Secondary | ICD-10-CM | POA: Diagnosis not present

## 2020-09-26 DIAGNOSIS — Z13220 Encounter for screening for lipoid disorders: Secondary | ICD-10-CM | POA: Diagnosis not present

## 2020-09-26 DIAGNOSIS — E669 Obesity, unspecified: Secondary | ICD-10-CM | POA: Diagnosis not present

## 2020-11-19 DIAGNOSIS — U071 COVID-19: Secondary | ICD-10-CM | POA: Diagnosis not present

## 2021-02-16 DIAGNOSIS — A09 Infectious gastroenteritis and colitis, unspecified: Secondary | ICD-10-CM | POA: Diagnosis not present

## 2021-02-16 DIAGNOSIS — R110 Nausea: Secondary | ICD-10-CM | POA: Diagnosis not present

## 2021-05-20 DIAGNOSIS — R109 Unspecified abdominal pain: Secondary | ICD-10-CM | POA: Diagnosis not present

## 2021-05-20 DIAGNOSIS — R197 Diarrhea, unspecified: Secondary | ICD-10-CM | POA: Diagnosis not present

## 2021-05-22 DIAGNOSIS — R197 Diarrhea, unspecified: Secondary | ICD-10-CM | POA: Diagnosis not present

## 2021-05-31 IMAGING — DX DG CHEST 2V
2 series · 2 of 2 positions shown · non-contrast
Comparison: 04/19/2012

CLINICAL DATA: Shortness of breath

EXAM:
CHEST - 2 VIEW

[chest pa]
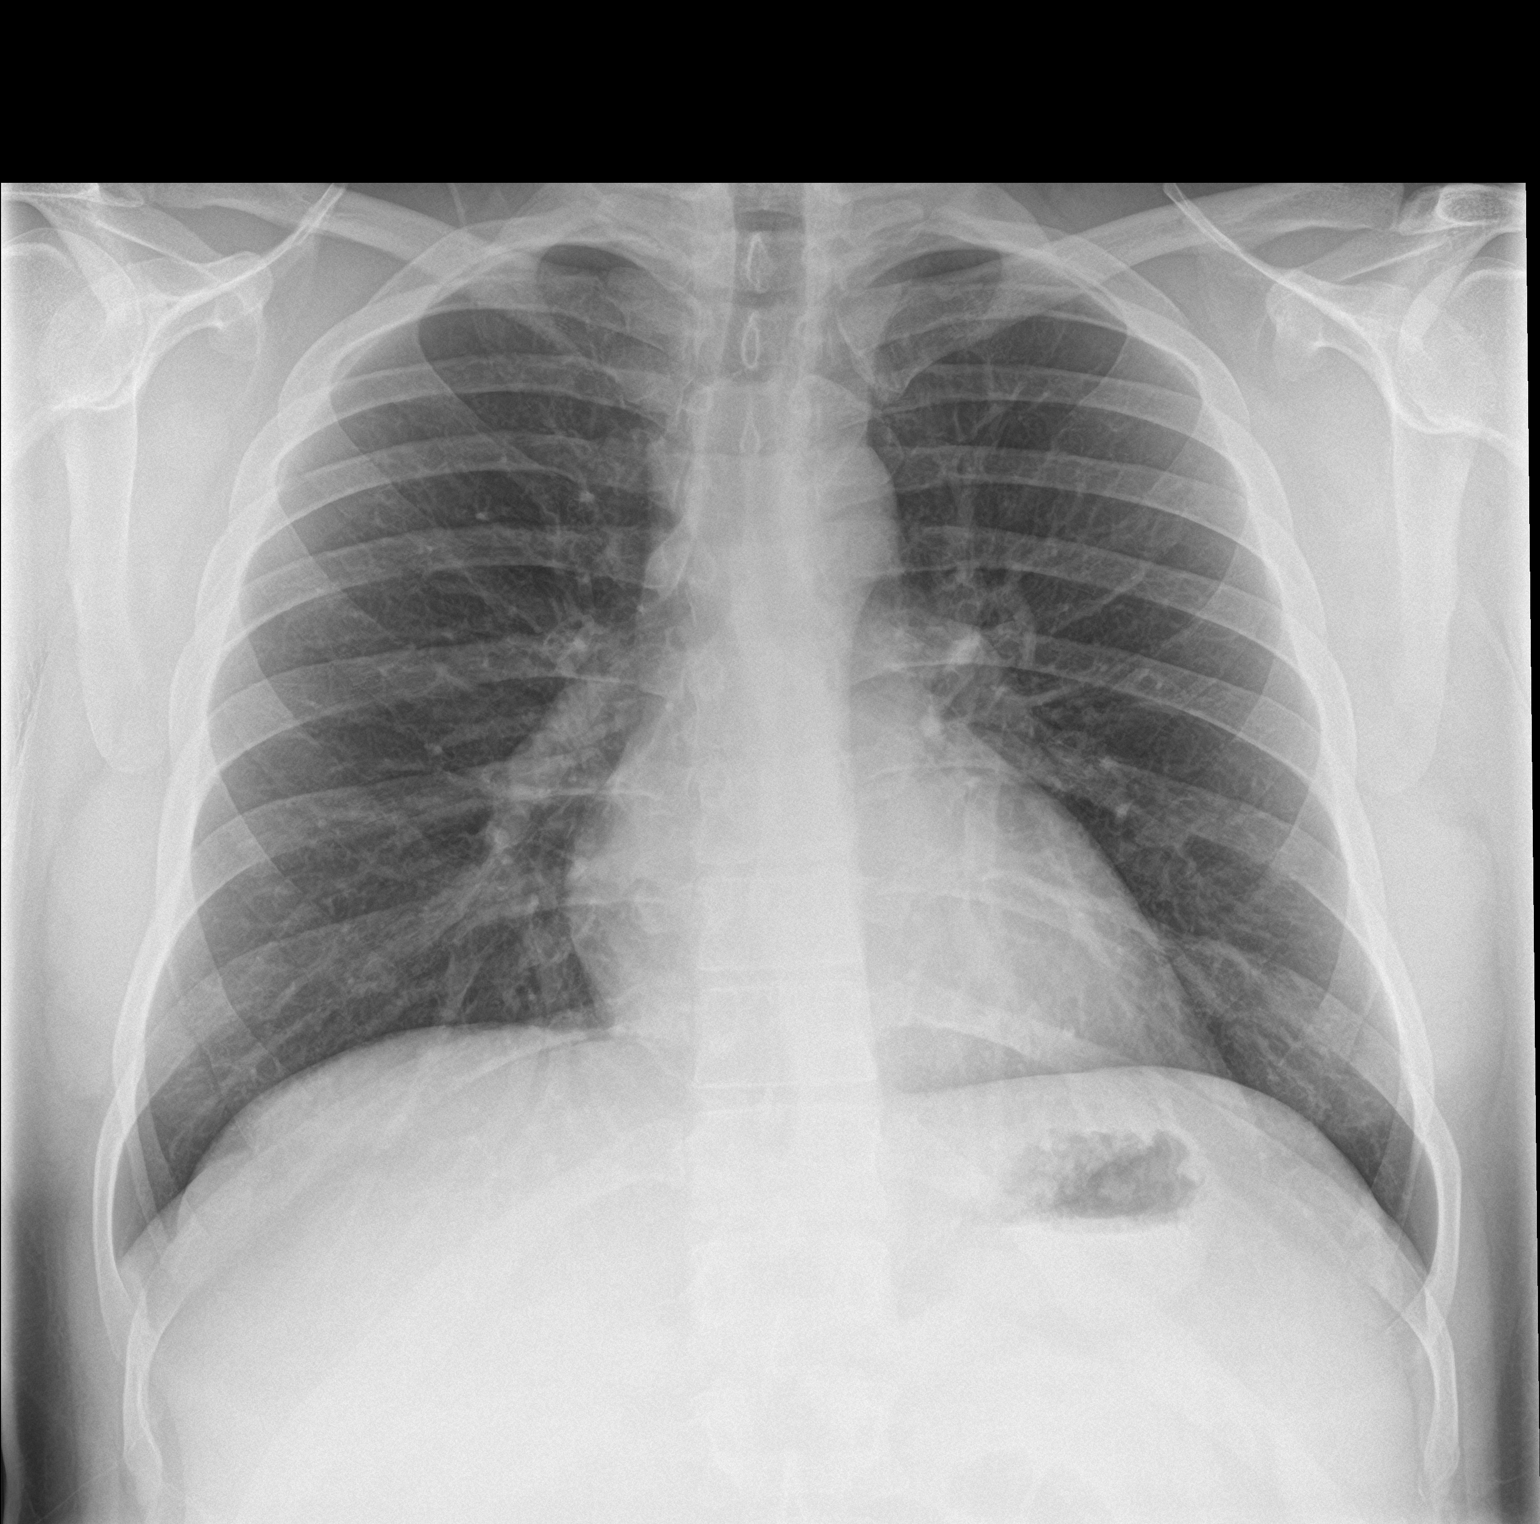

[chest lat]
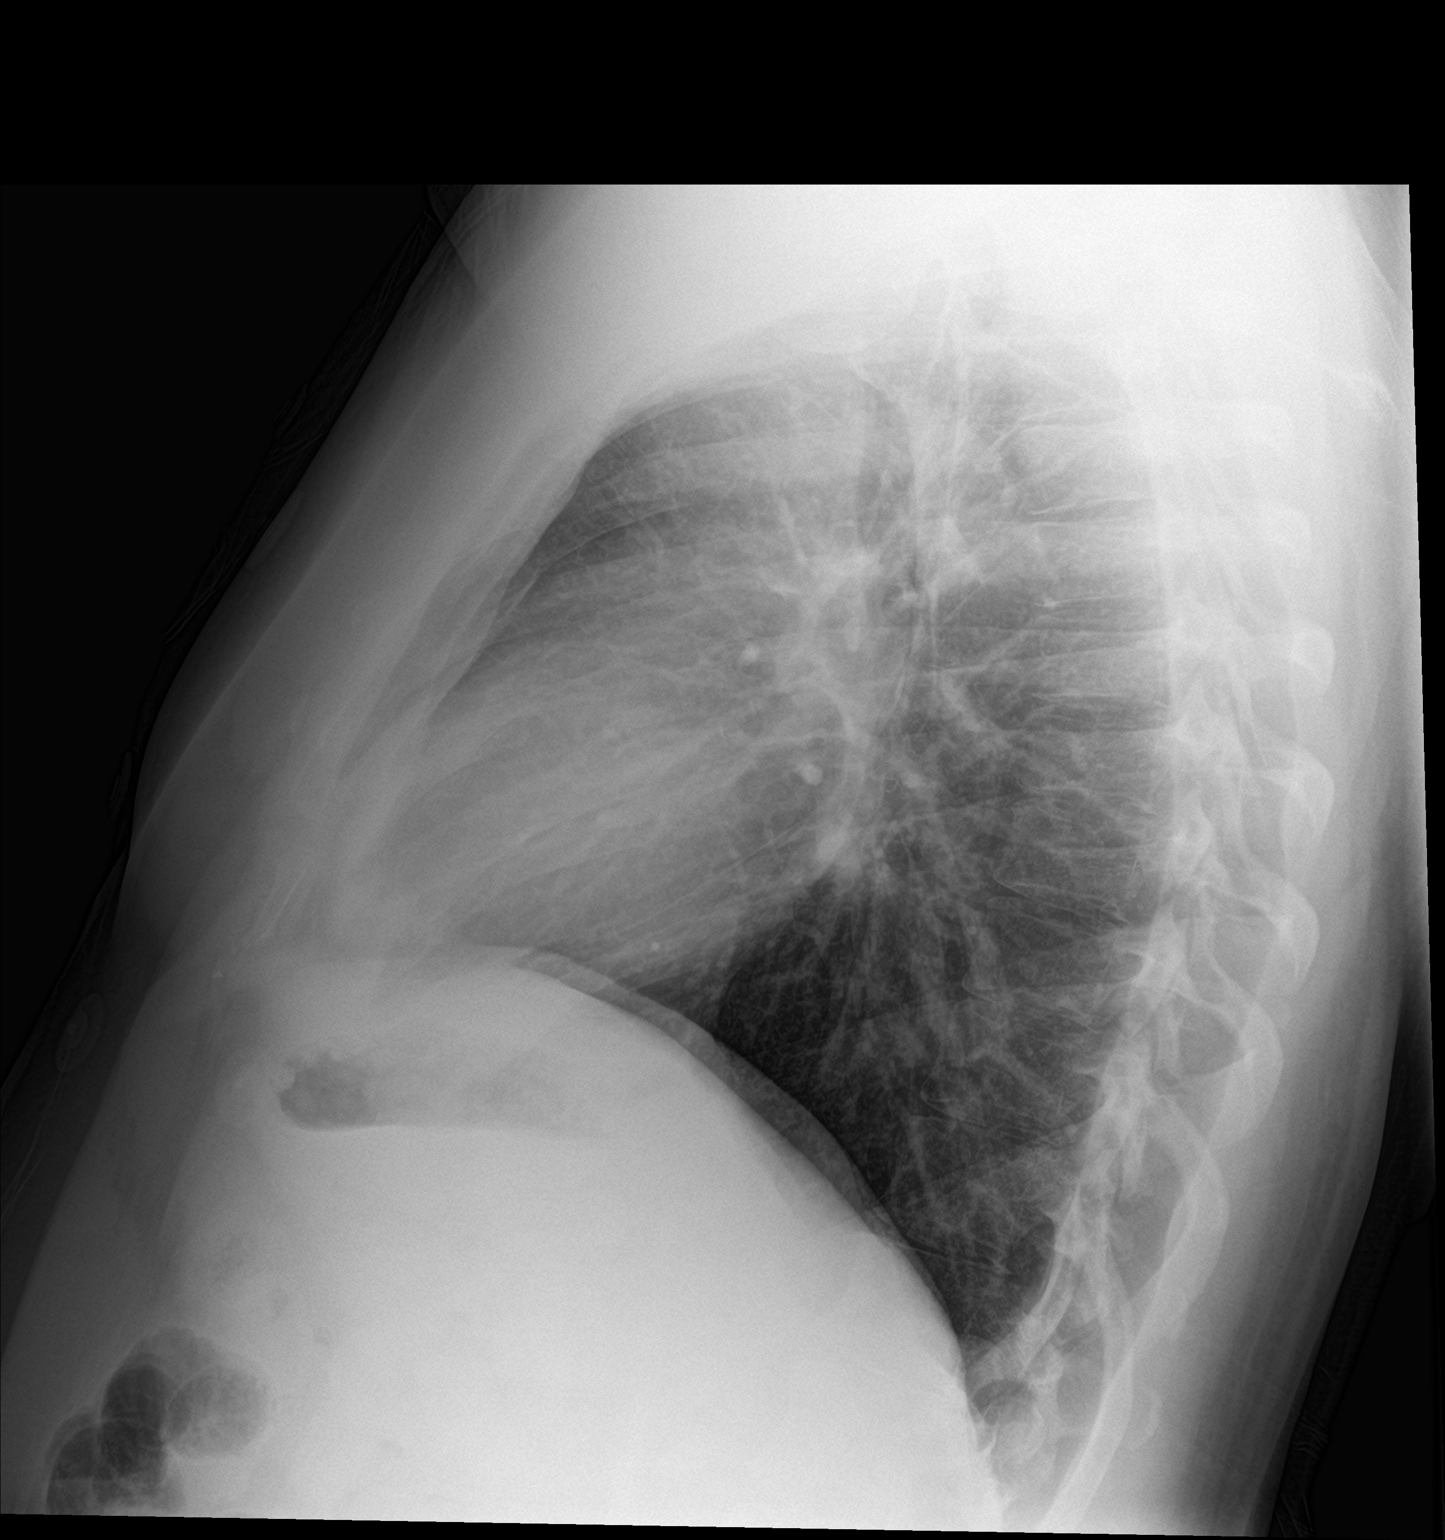

[2 of 2 positions shown; findings below may reference images not displayed]

FINDINGS: Normal heart size and mediastinal contours. No acute infiltrate or
edema. No effusion or pneumothorax. No acute osseous findings.
IMPRESSION: Negative chest

## 2021-06-22 DIAGNOSIS — K589 Irritable bowel syndrome without diarrhea: Secondary | ICD-10-CM | POA: Diagnosis not present

## 2021-06-29 DIAGNOSIS — D229 Melanocytic nevi, unspecified: Secondary | ICD-10-CM | POA: Diagnosis not present

## 2021-06-29 DIAGNOSIS — D239 Other benign neoplasm of skin, unspecified: Secondary | ICD-10-CM | POA: Diagnosis not present

## 2021-09-10 DIAGNOSIS — J029 Acute pharyngitis, unspecified: Secondary | ICD-10-CM | POA: Diagnosis not present
